# Patient Record
Sex: Male | Born: 1995 | Race: Black or African American | Hispanic: No | Marital: Single | Smoking: Never smoker
Health system: Southern US, Community
[De-identification: ages and names within clinical notes are randomized; demographics above are authoritative.]

## PROBLEM LIST (undated history)

## (undated) DIAGNOSIS — J45909 Unspecified asthma, uncomplicated: Secondary | ICD-10-CM

## (undated) HISTORY — DX: Unspecified asthma, uncomplicated: J45.909

---
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
## 2017-09-15 IMAGING — DX DG CHEST 2V
2 series · 2 of 2 positions shown · non-contrast
Comparison: None.

CLINICAL DATA: An 20-year-old male with fever and cough.

EXAM:
CHEST  2 VIEW

[chest pa]
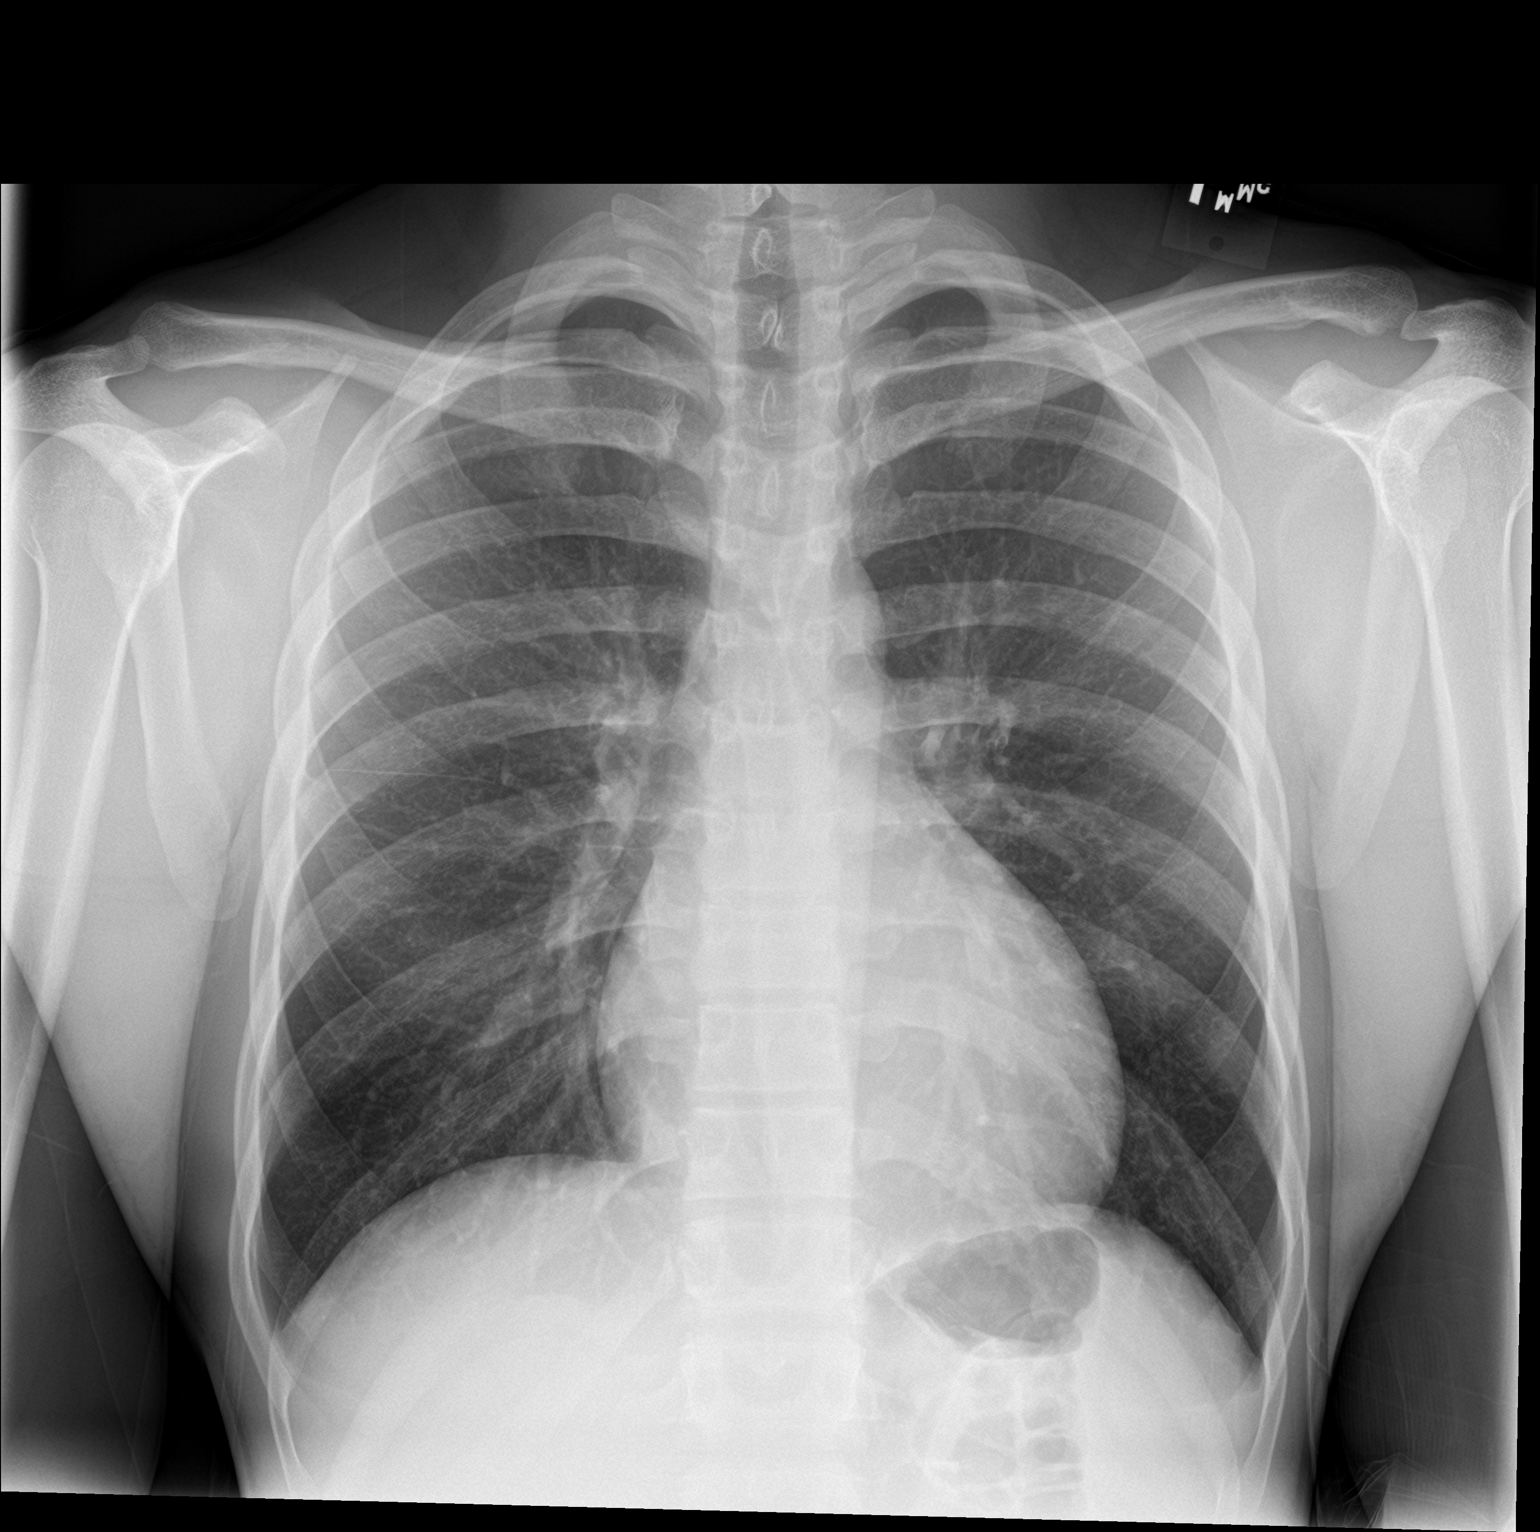

[chest lat]
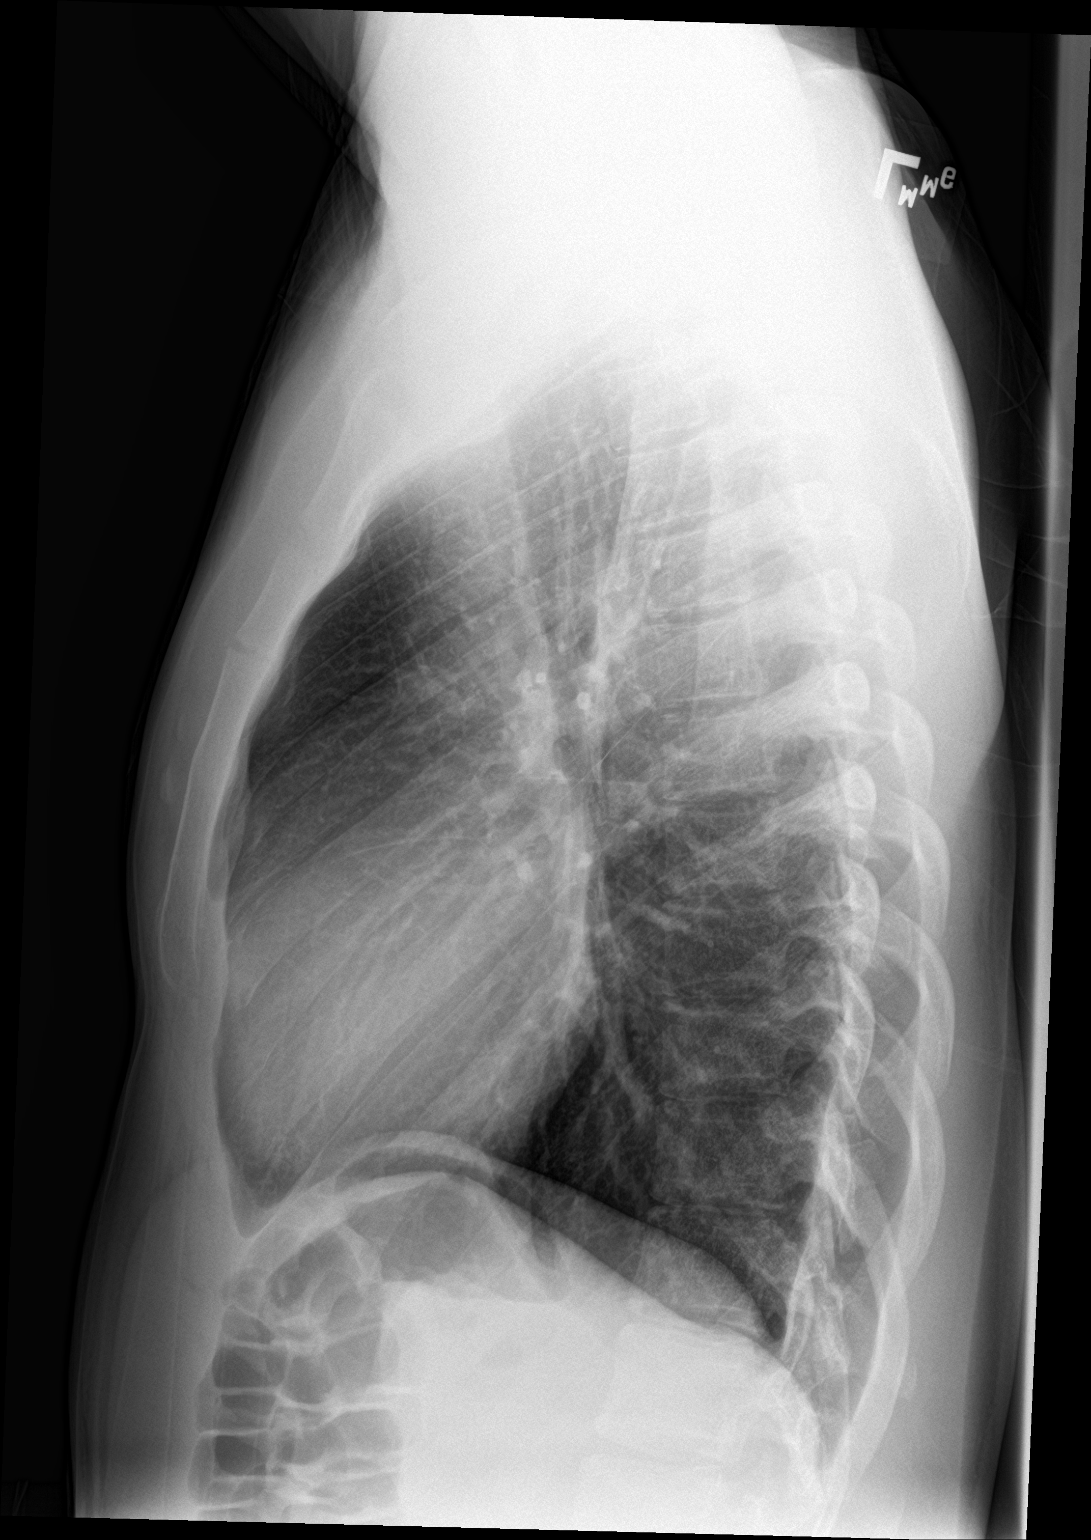

[2 of 2 positions shown; findings below may reference images not displayed]

FINDINGS: The heart size and mediastinal contours are within normal limits.
Both lungs are clear. The visualized skeletal structures are
unremarkable.
IMPRESSION: No active cardiopulmonary disease.

## 2018-11-25 ENCOUNTER — Telehealth: Payer: Self-pay | Admitting: General Practice

## 2018-11-25 ENCOUNTER — Ambulatory Visit: Payer: BC Managed Care – PPO | Admitting: Osteopathic Medicine

## 2018-11-25 NOTE — Telephone Encounter (Signed)
Patient walked into clinic and stated he couldn't keep appointment for today. He thought it was earlier in the day. Patient wanted to reschdule. He is aware you were booking out far. States because of work. He has been reshcduled. No further questions at this time.

## 2018-11-25 NOTE — Telephone Encounter (Signed)
Noted. Thanks for the update.  

## 2019-01-13 ENCOUNTER — Telehealth: Payer: Self-pay | Admitting: Osteopathic Medicine

## 2019-01-13 ENCOUNTER — Ambulatory Visit: Payer: BC Managed Care – PPO | Admitting: Osteopathic Medicine

## 2019-01-13 NOTE — Telephone Encounter (Signed)
Patient said he will call back to reschedule an appointment at a later time. No further questions at time.

## 2019-01-13 NOTE — Telephone Encounter (Signed)
No-show to establish care with Dr. Sheppard Coil, 01/13/19. If late or no-show again, will not accept patient to this clinic. Please call him to reschedule visit and inform them of this policy.

## 2019-12-21 ENCOUNTER — Ambulatory Visit (INDEPENDENT_AMBULATORY_CARE_PROVIDER_SITE_OTHER): Payer: BC Managed Care – PPO | Admitting: Family Medicine

## 2019-12-21 ENCOUNTER — Other Ambulatory Visit: Payer: Self-pay

## 2019-12-21 ENCOUNTER — Encounter: Payer: Self-pay | Admitting: Family Medicine

## 2019-12-21 DIAGNOSIS — Z0000 Encounter for general adult medical examination without abnormal findings: Secondary | ICD-10-CM | POA: Diagnosis not present

## 2019-12-21 NOTE — Patient Instructions (Signed)
Preventive Care 19-22 Years Old, Male Preventive care refers to lifestyle choices and visits with your health care provider that can promote health and wellness. This includes:  A yearly physical exam. This is also called an annual well check.  Regular dental and eye exams.  Immunizations.  Screening for certain conditions.  Healthy lifestyle choices, such as eating a healthy diet, getting regular exercise, not using drugs or products that contain nicotine and tobacco, and limiting alcohol use. What can I expect for my preventive care visit? Physical exam Your health care provider will check:  Height and weight. These may be used to calculate body mass index (BMI), which is a measurement that tells if you are at a healthy weight.  Heart rate and blood pressure.  Your skin for abnormal spots. Counseling Your health care provider may ask you questions about:  Alcohol, tobacco, and drug use.  Emotional well-being.  Home and relationship well-being.  Sexual activity.  Eating habits.  Work and work Statistician. What immunizations do I need?  Influenza (flu) vaccine  This is recommended every year. Tetanus, diphtheria, and pertussis (Tdap) vaccine  You may need a Td booster every 10 years. Varicella (chickenpox) vaccine  You may need this vaccine if you have not already been vaccinated. Human papillomavirus (HPV) vaccine  If recommended by your health care provider, you may need three doses over 6 months. Measles, mumps, and rubella (MMR) vaccine  You may need at least one dose of MMR. You may also need a second dose. Meningococcal conjugate (MenACWY) vaccine  One dose is recommended if you are 45-76 years old and a Market researcher living in a residence hall, or if you have one of several medical conditions. You may also need additional booster doses. Pneumococcal conjugate (PCV13) vaccine  You may need this if you have certain conditions and were not  previously vaccinated. Pneumococcal polysaccharide (PPSV23) vaccine  You may need one or two doses if you smoke cigarettes or if you have certain conditions. Hepatitis A vaccine  You may need this if you have certain conditions or if you travel or work in places where you may be exposed to hepatitis A. Hepatitis B vaccine  You may need this if you have certain conditions or if you travel or work in places where you may be exposed to hepatitis B. Haemophilus influenzae type b (Hib) vaccine  You may need this if you have certain risk factors. You may receive vaccines as individual doses or as more than one vaccine together in one shot (combination vaccines). Talk with your health care provider about the risks and benefits of combination vaccines. What tests do I need? Blood tests  Lipid and cholesterol levels. These may be checked every 5 years starting at age 17.  Hepatitis C test.  Hepatitis B test. Screening   Diabetes screening. This is done by checking your blood sugar (glucose) after you have not eaten for a while (fasting).  Sexually transmitted disease (STD) testing. Talk with your health care provider about your test results, treatment options, and if necessary, the need for more tests. Follow these instructions at home: Eating and drinking   Eat a diet that includes fresh fruits and vegetables, whole grains, lean protein, and low-fat dairy products.  Take vitamin and mineral supplements as recommended by your health care provider.  Do not drink alcohol if your health care provider tells you not to drink.  If you drink alcohol: ? Limit how much you have to 0-2  drinks a day. ? Be aware of how much alcohol is in your drink. In the [place] one drink equals one 12 oz bottle of beer (355 mL), one 5 oz glass of wine (148 mL), or one 1 oz glass of hard liquor (44 mL). Lifestyle  Take daily care of your teeth and gums.  Stay active. Exercise for at least 30 minutes on 5 or  more days each week.  Do not use any products that contain nicotine or tobacco, such as cigarettes, e-cigarettes, and chewing tobacco. If you need help quitting, ask your health care provider.  If you are sexually active, practice safe sex. Use a condom or other form of protection to prevent STIs (sexually transmitted infections). What's next?  Go to your health care provider once a year for a well check visit.  Ask your health care provider how often you should have your eyes and teeth checked.  Stay up to date on all vaccines. This information is not intended to replace advice given to you by your health care provider. Make sure you discuss any questions you have with your health care provider. Document Revised: 01/02/2018 Document Reviewed: 01/02/2018 Elsevier Patient Education  2020 [employer].

## 2019-12-21 NOTE — Assessment & Plan Note (Signed)
Well adult Immunizations:  Declines flu vaccine, otherwise Up to date.  Screenings: Deferred Hep C/HIV for now.   Anticipatory guidance/Risk factor reduction:  Counseled on marijuana use.  Continue healthy exercise and dietary habits.  Additional recommendations per AVS.

## 2019-12-21 NOTE — Progress Notes (Signed)
Dash Cardarelli - 24 y.o. male MRN [id number]  Date of birth: 1995/09/25  Subjective Chief Complaint  Patient presents with  . Establish Care  . Annual Exam    HPI Erik Waller is a 24 y.o. male here today for initial visit and annual exam.  He has a history of intermittent asthma but has been in good health otherwise.  He denies new concerns today.   He has a grandmother with HTN and stroke but family history otherwise is unremarkable.   He does not use tobacco products.  He does smoke marijuana a couple of times per week.   He denies EtOH use.   He works as a Systems analyst.  He had COVID vaccines.  Declines flu vaccine today.   Review of Systems  Constitutional: Negative for chills, fever, malaise/fatigue and weight loss.  HENT: Negative for congestion, ear pain and sore throat.   Eyes: Negative for blurred vision, double vision and pain.  Respiratory: Negative for cough and shortness of breath.   Cardiovascular: Negative for chest pain and palpitations.  Gastrointestinal: Negative for abdominal pain, blood in stool, constipation, heartburn and nausea.  Genitourinary: Negative for dysuria and urgency.  Musculoskeletal: Negative for joint pain and myalgias.  Neurological: Negative for dizziness and headaches.  Endo/Heme/Allergies: Does not bruise/bleed easily.  Psychiatric/Behavioral: Negative for depression. The patient is not nervous/anxious and does not have insomnia.     No Known Allergies  Past Medical History:  Diagnosis Date  . Asthma     History reviewed. No pertinent surgical history.  Social History   Socioeconomic History  . Marital status: Single    Spouse name: Not on file  . Number of children: Not on file  . Years of education: Not on file  . Highest education level: Not on file  Occupational History  . Not on file  Tobacco Use  . Smoking status: Never Smoker  . Smokeless tobacco: Never Used  Substance and Sexual Activity  . Alcohol  use: No  . Drug use: Yes    Types: Marijuana  . Sexual activity: Not on file  Other Topics Concern  . Not on file  Social History Narrative  . Not on file   Social Determinants of Health   Financial Resource Strain:   . Difficulty of Paying Living Expenses: Not on file  Food Insecurity:   . Worried About Programme researcher, broadcasting/film/video in the Last Year: Not on file  . Ran Out of Food in the Last Year: Not on file  Transportation Needs:   . Lack of Transportation (Medical): Not on file  . Lack of Transportation (Non-Medical): Not on file  Physical Activity:   . Days of Exercise per Week: Not on file  . Minutes of Exercise per Session: Not on file  Stress:   . Feeling of Stress : Not on file  Social Connections:   . Frequency of Communication with Friends and Family: Not on file  . Frequency of Social Gatherings with Friends and Family: Not on file  . Attends Religious Services: Not on file  . Active Member of Clubs or Organizations: Not on file  . Attends Banker Meetings: Not on file  . Marital Status: Not on file    Family History  Problem Relation Age of Onset  . Hypertension Maternal Grandmother   . Stroke Maternal Grandmother     Health Maintenance  Topic Date Due  . Hepatitis C Screening  Never done  . HIV Screening  Never done  . TETANUS/TDAP  12/02/2017  . INFLUENZA VACCINE  04/21/2020 (Originally 08/23/2019)  . COVID-19 Vaccine  Completed     ----------------------------------------------------------------------------------------------------------------------------------------------------------------------------------------------------------------- Physical Exam BP (!) 114/48 (BP Location: Left Arm, Patient Position: Sitting, Cuff Size: Normal)   Pulse (!) 55   Temp 98 F (36.7 C)   Ht 5\' 7"  (1.702 m)   Wt 151 lb (68.5 kg)   SpO2 100%   BMI 23.65 kg/m   Physical Exam Constitutional:      General: He is not in acute distress.    Appearance: Normal  appearance.  HENT:     Head: Normocephalic and atraumatic.     Right Ear: Tympanic membrane and external ear normal.     Left Ear: Tympanic membrane and external ear normal.  Eyes:     General: No scleral icterus. Neck:     Thyroid: No thyromegaly.  Cardiovascular:     Rate and Rhythm: Normal rate and regular rhythm.     Heart sounds: Normal heart sounds.  Pulmonary:     Effort: Pulmonary effort is normal.     Breath sounds: Normal breath sounds.  Abdominal:     General: Bowel sounds are normal. There is no distension.     Palpations: Abdomen is soft.     Tenderness: There is no abdominal tenderness. There is no guarding.  Musculoskeletal:     Cervical back: Normal range of motion.  Lymphadenopathy:     Cervical: No cervical adenopathy.  Skin:    General: Skin is warm and dry.     Findings: No rash.  Neurological:     General: No focal deficit present.     Mental Status: He is alert and oriented to person, place, and time.     Cranial Nerves: No cranial nerve deficit.     Motor: No abnormal muscle tone.  Psychiatric:        Mood and Affect: Mood normal.        Behavior: Behavior normal.     ------------------------------------------------------------------------------------------------------------------------------------------------------------------------------------------------------------------- Assessment and Plan  Well adult exam Well adult Immunizations:  Declines flu vaccine, otherwise Up to date.  Screenings: Deferred Hep C/HIV for now.   Anticipatory guidance/Risk factor reduction:  Counseled on marijuana use.  Continue healthy exercise and dietary habits.  Additional recommendations per AVS.     No orders of the defined types were placed in this encounter.   No follow-ups on file.    This visit occurred during the SARS-CoV-2 public health emergency.  Safety protocols were in place, including screening questions prior to the visit, additional usage of  staff PPE, and extensive cleaning of exam room while observing appropriate contact time as indicated for disinfecting solutions.

## 2022-02-21 ENCOUNTER — Encounter: Payer: Self-pay | Admitting: Family Medicine

## 2022-02-21 ENCOUNTER — Ambulatory Visit (INDEPENDENT_AMBULATORY_CARE_PROVIDER_SITE_OTHER): Payer: No Typology Code available for payment source | Admitting: Family Medicine

## 2022-02-21 VITALS — BP 117/65 | HR 54 | Ht 70.0 in | Wt 154.0 lb

## 2022-02-21 DIAGNOSIS — Z0000 Encounter for general adult medical examination without abnormal findings: Secondary | ICD-10-CM | POA: Diagnosis not present

## 2022-02-21 DIAGNOSIS — H0012 Chalazion right lower eyelid: Secondary | ICD-10-CM

## 2022-02-21 DIAGNOSIS — Z13220 Encounter for screening for lipoid disorders: Secondary | ICD-10-CM

## 2022-02-21 LAB — CBC WITH DIFFERENTIAL/PLATELET
Basophils Absolute: 20 cells/uL (ref 0–200)
HCT: 44.5 % (ref 38.5–50.0)
Hemoglobin: 15.3 g/dL (ref 13.2–17.1)
Lymphs Abs: 2077 cells/uL (ref 850–3900)
MCH: 30.4 pg (ref 27.0–33.0)
MCHC: 34.4 g/dL (ref 32.0–36.0)
MPV: 10.6 fL (ref 7.5–12.5)
Platelets: 212 10*3/uL (ref 140–400)
RBC: 5.03 10*6/uL (ref 4.20–5.80)
RDW: 12.1 % (ref 11.0–15.0)
WBC: 6.7 10*3/uL (ref 3.8–10.8)

## 2022-02-21 NOTE — Progress Notes (Signed)
Erik Waller - 27 y.o. male MRN [id number]  Date of birth: [birth year]  Subjective No chief complaint on file.   HPI Erik Waller is a 27 year old male here today for annual exam.  Reports he is doing fairly well.  He does have a swollen area under the right eye.  This has been present for about 3 months.  He has tried warm compresses without much improvement.  He does stay pretty active.  He works as a Physiological scientist.  He feels like his diet is pretty healthy.  He does use marijuana occasionally.  Rare alcohol consumption.  Declines immunizations at this time.  Review of Systems  Constitutional:  Negative for chills, fever, malaise/fatigue and weight loss.  HENT:  Negative for congestion, ear pain and sore throat.   Eyes:  Negative for blurred vision, double vision and pain.  Respiratory:  Negative for cough and shortness of breath.   Cardiovascular:  Negative for chest pain and palpitations.  Gastrointestinal:  Negative for abdominal pain, blood in stool, constipation, heartburn and nausea.  Genitourinary:  Negative for dysuria and urgency.  Musculoskeletal:  Negative for joint pain and myalgias.  Neurological:  Negative for dizziness and headaches.  Endo/Heme/Allergies:  Does not bruise/bleed easily.  Psychiatric/Behavioral:  Negative for depression. The patient is not nervous/anxious and does not have insomnia.     No Known Allergies  Past Medical History:  Diagnosis Date   Asthma     History reviewed. No pertinent surgical history.  Social History   Socioeconomic History   Marital status: Single    Spouse name: Not on file   Number of children: Not on file   Years of education: Not on file   Highest education level: Not on file  Occupational History   Not on file  Tobacco Use   Smoking status: Never   Smokeless tobacco: Never  Substance and Sexual Activity   Alcohol use: No   Drug use: Yes    Types: Marijuana   Sexual activity: Yes    Partners:  Female    Birth control/protection: Condom  Other Topics Concern   Not on file  Social History Narrative   Not on file   Social Determinants of Health   Financial Resource Strain: Not on file  Food Insecurity: Not on file  Transportation Needs: Not on file  Physical Activity: Not on file  Stress: Not on file  Social Connections: Not on file    Family History  Problem Relation Age of Onset   Hypertension Maternal Grandmother    Stroke Maternal Grandmother     Health Maintenance  Topic Date Due   HIV Screening  Never done   Hepatitis C Screening  Never done   DTaP/Tdap/Td (8 - Td or Tdap) 12/02/2017   COVID-19 Vaccine (3 - 2023-24 season) 09/22/2021   INFLUENZA VACCINE  Completed   HPV VACCINES  Completed     ----------------------------------------------------------------------------------------------------------------------------------------------------------------------------------------------------------------- Physical Exam BP 117/65   Pulse (!) 54   Ht 5\' 10"  (1.778 m)   Wt 154 lb (69.9 kg)   SpO2 98%   BMI 22.10 kg/m   Physical Exam Constitutional:      General: He is not in acute distress. HENT:     Head: Normocephalic and atraumatic.     Right Ear: Tympanic membrane and external ear normal.     Left Ear: Tympanic membrane and external ear normal.  Eyes:     General: No scleral icterus. Neck:     Thyroid: No thyromegaly.  Cardiovascular:     Rate and Rhythm: Normal rate and regular rhythm.     Heart sounds: Normal heart sounds.  Pulmonary:     Effort: Pulmonary effort is normal.     Breath sounds: Normal breath sounds.  Abdominal:     General: Bowel sounds are normal. There is no distension.     Palpations: Abdomen is soft.     Tenderness: There is no abdominal tenderness. There is no guarding.  Musculoskeletal:     Cervical back: Normal range of motion.  Lymphadenopathy:     Cervical: No cervical adenopathy.  Skin:    General: Skin is warm  and dry.     Findings: No rash.  Neurological:     Mental Status: He is alert and oriented to person, place, and time.     Cranial Nerves: No cranial nerve deficit.     Motor: No abnormal muscle tone.  Psychiatric:        Mood and Affect: Mood normal.        Behavior: Behavior normal.     ------------------------------------------------------------------------------------------------------------------------------------------------------------------------------------------------------------------- Assessment and Plan  Well adult exam Well adult Orders Placed This Encounter  Procedures   COMPLETE METABOLIC PANEL WITH GFR   CBC with Differential   Lipid Panel w/reflex Direct LDL   Ambulatory referral to Ophthalmology    Referral Priority:   Routine    Referral Type:   Consultation    Referral Reason:   Specialty Services Required    Requested Specialty:   Ophthalmology    Number of Visits Requested:   1  Screenings: Per lab orders Immunizations: Declines Anticipatory guidance/risk factor reduction: Recommendations previous   No orders of the defined types were placed in this encounter.   No follow-ups on file.    This visit occurred during the SARS-CoV-2 public health emergency.  Safety protocols were in place, including screening questions prior to the visit, additional usage of staff PPE, and extensive cleaning of exam room while observing appropriate contact time as indicated for disinfecting solutions.

## 2022-02-21 NOTE — Patient Instructions (Signed)
Preventive Care 21-25 Years Old, Male Preventive care refers to lifestyle choices and visits with your health care provider that can promote health and wellness. Preventive care visits are also called wellness exams. What can I expect for my preventive care visit? Counseling During your preventive care visit, your health care provider may ask about your: Medical history, including: Past medical problems. Family medical history. Current health, including: Emotional well-being. Home life and relationship well-being. Sexual activity. Lifestyle, including: Alcohol, nicotine or tobacco, and drug use. Access to firearms. Diet, exercise, and sleep habits. Safety issues such as seatbelt and bike helmet use. Sunscreen use. Work and work environment. Physical exam Your health care provider may check your: Height and weight. These may be used to calculate your BMI (body mass index). BMI is a measurement that tells if you are at a healthy weight. Waist circumference. This measures the distance around your waistline. This measurement also tells if you are at a healthy weight and may help predict your risk of certain diseases, such as type 2 diabetes and high blood pressure. Heart rate and blood pressure. Body temperature. Skin for abnormal spots. What immunizations do I need?  Vaccines are usually given at various ages, according to a schedule. Your health care provider will recommend vaccines for you based on your age, medical history, and lifestyle or other factors, such as travel or where you work. What tests do I need? Screening Your health care provider may recommend screening tests for certain conditions. This may include: Lipid and cholesterol levels. Diabetes screening. This is done by checking your blood sugar (glucose) after you have not eaten for a while (fasting). Hepatitis B test. Hepatitis C test. HIV (human immunodeficiency virus) test. STI (sexually transmitted infection)  testing, if you are at risk. Talk with your health care provider about your test results, treatment options, and if necessary, the need for more tests. Follow these instructions at home: Eating and drinking  Eat a healthy diet that includes fresh fruits and vegetables, whole grains, lean protein, and low-fat dairy products. Drink enough fluid to keep your urine pale yellow. Take vitamin and mineral supplements as recommended by your health care provider. Do not drink alcohol if your health care provider tells you not to drink. If you drink alcohol: Limit how much you have to 0-2 drinks a day. Know how much alcohol is in your drink. In the [place] one drink equals one 12 oz bottle of beer (355 mL), one 5 oz glass of wine (148 mL), or one 1 oz glass of hard liquor (44 mL). Lifestyle Brush your teeth every morning and night with fluoride toothpaste. Floss one time each day. Exercise for at least 30 minutes 5 or more days each week. Do not use any products that contain nicotine or tobacco. These products include cigarettes, chewing tobacco, and vaping devices, such as e-cigarettes. If you need help quitting, ask your health care provider. Do not use drugs. If you are sexually active, practice safe sex. Use a condom or other form of protection to prevent STIs. Find healthy ways to manage stress, such as: Meditation, yoga, or listening to music. Journaling. Talking to a trusted person. Spending time with friends and family. Minimize exposure to UV radiation to reduce your risk of skin cancer. Safety Always wear your seat belt while driving or riding in a vehicle. Do not drive: If you have been drinking alcohol. Do not ride with someone who has been drinking. If you have been using any mind-altering substances   or drugs. While texting. When you are tired or distracted. Wear a helmet and other protective equipment during sports activities. If you have firearms in your house, make sure you  follow all gun safety procedures. Seek help if you have been physically or sexually abused. What's next? Go to your health care provider once a year for an annual wellness visit. Ask your health care provider how often you should have your eyes and teeth checked. Stay up to date on all vaccines. This information is not intended to replace advice given to you by your health care provider. Make sure you discuss any questions you have with your health care provider. Document Revised: 07/06/2020 Document Reviewed: 07/06/2020 Elsevier Patient Education  2023 [employer] Inc[employer]  

## 2022-02-21 NOTE — Assessment & Plan Note (Signed)
Well adult Orders Placed This Encounter  Procedures   COMPLETE METABOLIC PANEL WITH GFR   CBC with Differential   Lipid Panel w/reflex Direct LDL   Ambulatory referral to Ophthalmology    Referral Priority:   Routine    Referral Type:   Consultation    Referral Reason:   Specialty Services Required    Requested Specialty:   Ophthalmology    Number of Visits Requested:   1  Screenings: Per lab orders Immunizations: Declines Anticipatory guidance/risk factor reduction: Recommendations previous

## 2022-02-22 LAB — CBC WITH DIFFERENTIAL/PLATELET
Absolute Monocytes: 536 cells/uL (ref 200–950)
Basophils Relative: 0.3 %
Eosinophils Absolute: 40 cells/uL (ref 15–500)
Eosinophils Relative: 0.6 %
MCV: 88.5 fL (ref 80.0–100.0)
Monocytes Relative: 8 %
Neutro Abs: 4027 cells/uL (ref 1500–7800)
Neutrophils Relative %: 60.1 %
Total Lymphocyte: 31 %

## 2022-02-22 LAB — LIPID PANEL W/REFLEX DIRECT LDL
Cholesterol: 148 mg/dL (ref <200)
HDL: 57 mg/dL (ref >=40)
LDL Cholesterol (Calc): 76 mg/dL (calc)
Non-HDL Cholesterol (Calc): 91 mg/dL (calc) (ref <130)
Total CHOL/HDL Ratio: 2.6 (calc) (ref <5.0)
Triglycerides: 74 mg/dL (ref <150)

## 2022-02-22 LAB — COMPLETE METABOLIC PANEL WITH GFR
AG Ratio: 1.7 (calc) (ref 1.0–2.5)
ALT: 25 U/L (ref 9–46)
AST: 21 U/L (ref 10–40)
Albumin: 4.8 g/dL (ref 3.6–5.1)
Alkaline phosphatase (APISO): 50 U/L (ref 36–130)
BUN: 10 mg/dL (ref 7–25)
CO2: 32 mmol/L (ref 20–32)
Calcium: 9.7 mg/dL (ref 8.6–10.3)
Chloride: 104 mmol/L (ref 98–110)
Creat: 0.92 mg/dL (ref 0.60–1.24)
Globulin: 2.9 g/dL (calc) (ref 1.9–3.7)
Glucose, Bld: 67 mg/dL (ref 65–99)
Potassium: 4.4 mmol/L (ref 3.5–5.3)
Sodium: 141 mmol/L (ref 135–146)
Total Bilirubin: 0.7 mg/dL (ref 0.2–1.2)
Total Protein: 7.7 g/dL (ref 6.1–8.1)
eGFR: 119 mL/min/{1.73_m2} (ref >=60)

## 2023-05-15 ENCOUNTER — Encounter: Admitting: Family Medicine

## 2023-05-21 ENCOUNTER — Encounter: Admitting: Family Medicine

## 2023-05-28 ENCOUNTER — Encounter: Admitting: Family Medicine

## 2023-05-29 ENCOUNTER — Encounter: Admitting: Family Medicine

## 2023-05-29 ENCOUNTER — Encounter (HOSPITAL_COMMUNITY): Payer: Self-pay

## 2023-06-05 ENCOUNTER — Ambulatory Visit (INDEPENDENT_AMBULATORY_CARE_PROVIDER_SITE_OTHER): Admitting: Family Medicine

## 2023-06-05 VITALS — BP 114/67 | HR 57 | Ht 70.0 in | Wt 163.0 lb

## 2023-06-05 DIAGNOSIS — Z13220 Encounter for screening for lipoid disorders: Secondary | ICD-10-CM | POA: Diagnosis not present

## 2023-06-05 DIAGNOSIS — Z0000 Encounter for general adult medical examination without abnormal findings: Secondary | ICD-10-CM

## 2023-06-05 NOTE — Patient Instructions (Signed)
 Preventive Care 76-26 Years Old, Male Preventive care refers to lifestyle choices and visits with your health care provider that can promote health and wellness. Preventive care visits are also called wellness exams. What can I expect for my preventive care visit? Counseling During your preventive care visit, your health care provider may ask about your: Medical history, including: Past medical problems. Family medical history. Current health, including: Emotional well-being. Home life and relationship well-being. Sexual activity. Lifestyle, including: Alcohol, nicotine or tobacco, and drug use. Access to firearms. Diet, exercise, and sleep habits. Safety issues such as seatbelt and bike helmet use. Sunscreen use. Work and work Astronomer. Physical exam Your health care provider may check your: Height and weight. These may be used to calculate your BMI (body mass index). BMI is a measurement that tells if you are at a healthy weight. Waist circumference. This measures the distance around your waistline. This measurement also tells if you are at a healthy weight and may help predict your risk of certain diseases, such as type 2 diabetes and high blood pressure. Heart rate and blood pressure. Body temperature. Skin for abnormal spots. What immunizations do I need?  Vaccines are usually given at various ages, according to a schedule. Your health care provider will recommend vaccines for you based on your age, medical history, and lifestyle or other factors, such as travel or where you work. What tests do I need? Screening Your health care provider may recommend screening tests for certain conditions. This may include: Lipid and cholesterol levels. Diabetes screening. This is done by checking your blood sugar (glucose) after you have not eaten for a while (fasting). Hepatitis B test. Hepatitis C test. HIV (human immunodeficiency virus) test. STI (sexually transmitted infection)  testing, if you are at risk. Talk with your health care provider about your test results, treatment options, and if necessary, the need for more tests. Follow these instructions at home: Eating and drinking  Eat a healthy diet that includes fresh fruits and vegetables, whole grains, lean protein, and low-fat dairy products. Drink enough fluid to keep your urine pale yellow. Take vitamin and mineral supplements as recommended by your health care provider. Do not drink alcohol if your health care provider tells you not to drink. If you drink alcohol: Limit how much you have to 0-2 drinks a day. Know how much alcohol is in your drink. In the [place] one drink equals one 12 oz bottle of beer (355 mL), one 5 oz glass of wine (148 mL), or one 1 oz glass of hard liquor (44 mL). Lifestyle Brush your teeth every morning and night with fluoride toothpaste. Floss one time each day. Exercise for at least 30 minutes 5 or more days each week. Do not use any products that contain nicotine or tobacco. These products include cigarettes, chewing tobacco, and vaping devices, such as e-cigarettes. If you need help quitting, ask your health care provider. Do not use drugs. If you are sexually active, practice safe sex. Use a condom or other form of protection to prevent STIs. Find healthy ways to manage stress, such as: Meditation, yoga, or listening to music. Journaling. Talking to a trusted person. Spending time with friends and family. Minimize exposure to UV radiation to reduce your risk of skin cancer. Safety Always wear your seat belt while driving or riding in a vehicle. Do not drive: If you have been drinking alcohol. Do not ride with someone who has been drinking. If you have been using any mind-altering substances  or drugs. While texting. When you are tired or distracted. Wear a helmet and other protective equipment during sports activities. If you have firearms in your house, make sure you  follow all gun safety procedures. Seek help if you have been physically or sexually abused. What's next? Go to your health care provider once a year for an annual wellness visit. Ask your health care provider how often you should have your eyes and teeth checked. Stay up to date on all vaccines. This information is not intended to replace advice given to you by your health care provider. Make sure you discuss any questions you have with your health care provider. Document Revised: 07/06/2020 Document Reviewed: 07/06/2020 Elsevier Patient Education  2024 [employer].

## 2023-06-05 NOTE — Assessment & Plan Note (Addendum)
 Well adult Orders Placed This Encounter  Procedures   Lipid panel   CBC with Differential/Platelet   CMP14+EGFR  Screenings: Per lab orders Immunizations: Declines Anticipatory guidance/risk factor reduction: Recommendations per AVS

## 2023-06-05 NOTE — Progress Notes (Signed)
 Erik Waller - 28 y.o. male MRN [id number]  Date of birth: Mar 11, 1995  Subjective Chief Complaint  Patient presents with   Annual Exam    HPI Erik Waller is a 28 y.o. male here today for annual exam.   He has been in good health and denies new concerns.   He does stay pretty active and feels that his diet is pretty good  He is a non-smoker.  Rare EtOH use.   He does have regular dental care.  Review of Systems  Constitutional:  Negative for chills, fever, malaise/fatigue and weight loss.  HENT:  Negative for congestion, ear pain and sore throat.   Eyes:  Negative for blurred vision, double vision and pain.  Respiratory:  Negative for cough and shortness of breath.   Cardiovascular:  Negative for chest pain and palpitations.  Gastrointestinal:  Negative for abdominal pain, blood in stool, constipation, heartburn and nausea.  Genitourinary:  Negative for dysuria and urgency.  Musculoskeletal:  Negative for joint pain and myalgias.  Neurological:  Negative for dizziness and headaches.  Endo/Heme/Allergies:  Does not bruise/bleed easily.  Psychiatric/Behavioral:  Negative for depression. The patient is not nervous/anxious and does not have insomnia.     No Known Allergies  Past Medical History:  Diagnosis Date   Asthma     No past surgical history on file.  Social History   Socioeconomic History   Marital status: Single    Spouse name: Not on file   Number of children: Not on file   Years of education: Not on file   Highest education level: Bachelor's degree (e.g., BA, AB, BS)  Occupational History   Not on file  Tobacco Use   Smoking status: Never   Smokeless tobacco: Never  Substance and Sexual Activity   Alcohol use: No   Drug use: Yes    Types: Marijuana   Sexual activity: Yes    Partners: Female    Birth control/protection: Condom  Other Topics Concern   Not on file  Social History Narrative   Not on file   Social Drivers of Health    Financial Resource Strain: Medium Risk (06/05/2023)   Overall Financial Resource Strain (CARDIA)    Difficulty of Paying Living Expenses: Somewhat hard  Food Insecurity: Food Insecurity Present (06/05/2023)   Hunger Vital Sign    Worried About Running Out of Food in the Last Year: Not on file    Ran Out of Food in the Last Year: Sometimes true  Transportation Needs: No Transportation Needs (06/05/2023)   PRAPARE - Administrator, Civil Service (Medical): No    Lack of Transportation (Non-Medical): No  Physical Activity: Insufficiently Active (06/05/2023)   Exercise Vital Sign    Days of Exercise per Week: 3 days    Minutes of Exercise per Session: 30 min  Stress: Stress Concern Present (06/05/2023)   [employer] of Occupational Health - Occupational Stress Questionnaire    Feeling of Stress : To some extent  Social Connections: Unknown (06/05/2023)   Social Connection and Isolation Panel [NHANES]    Frequency of Communication with Friends and Family: Three times a week    Frequency of Social Gatherings with Friends and Family: Twice a week    Attends Religious Services: Not on Marketing executive or Organizations: No    Attends Banker Meetings: Not on file    Marital Status: Not on file    Family History  Problem Relation  Age of Onset   Hypertension Maternal Grandmother    Stroke Maternal Grandmother     Health Maintenance  Topic Date Due   HIV Screening  Never done   Hepatitis C Screening  Never done   COVID-19 Vaccine (3 - 2024-25 season) 09/23/2022   INFLUENZA VACCINE  08/23/2023   DTaP/Tdap/Td (9 - Td or Tdap) 11/02/2031   Pneumococcal Vaccine 35-88 Years old  Completed   HPV VACCINES  Completed   Meningococcal B Vaccine  Completed      ----------------------------------------------------------------------------------------------------------------------------------------------------------------------------------------------------------------- Physical Exam BP 114/67 (BP Location: Left Arm, Patient Position: Sitting, Cuff Size: Normal)   Pulse (!) 57   Ht 5\' 10"  (1.778 m)   Wt 163 lb (73.9 kg)   SpO2 100%   BMI 23.39 kg/m   Physical Exam Constitutional:      General: He is not in acute distress. HENT:     Head: Normocephalic and atraumatic.     Right Ear: Tympanic membrane and external ear normal.     Left Ear: Tympanic membrane and external ear normal.  Eyes:     General: No scleral icterus. Neck:     Thyroid: No thyromegaly.  Cardiovascular:     Rate and Rhythm: Normal rate and regular rhythm.     Heart sounds: Normal heart sounds.  Pulmonary:     Effort: Pulmonary effort is normal.     Breath sounds: Normal breath sounds.  Abdominal:     General: Bowel sounds are normal. There is no distension.     Palpations: Abdomen is soft.     Tenderness: There is no abdominal tenderness. There is no guarding.  Musculoskeletal:     Cervical back: Normal range of motion.  Lymphadenopathy:     Cervical: No cervical adenopathy.  Skin:    General: Skin is warm and dry.     Findings: No rash.  Neurological:     Mental Status: He is alert and oriented to person, place, and time.     Cranial Nerves: No cranial nerve deficit.     Motor: No abnormal muscle tone.  Psychiatric:        Mood and Affect: Mood normal.        Behavior: Behavior normal.     ------------------------------------------------------------------------------------------------------------------------------------------------------------------------------------------------------------------- Assessment and Plan  Well adult exam Well adult Orders Placed This Encounter  Procedures   Lipid panel   CBC with Differential/Platelet   CMP14+EGFR   Screenings: Per lab orders Immunizations: Declines Anticipatory guidance/risk factor reduction: Recommendations per AVS   No orders of the defined types were placed in this encounter.   No follow-ups on file.

## 2023-06-06 LAB — CBC WITH DIFFERENTIAL/PLATELET
Basophils Absolute: 0 10*3/uL (ref 0.0–0.2)
Basos: 0 %
EOS (ABSOLUTE): 0.1 10*3/uL (ref 0.0–0.4)
Eos: 1 %
Hematocrit: 46.4 % (ref 37.5–51.0)
Hemoglobin: 15.3 g/dL (ref 13.0–17.7)
Immature Grans (Abs): 0 10*3/uL (ref 0.0–0.1)
Immature Granulocytes: 0 %
Lymphocytes Absolute: 1.9 10*3/uL (ref 0.7–3.1)
Lymphs: 33 %
MCH: 30.5 pg (ref 26.6–33.0)
MCHC: 33 g/dL (ref 31.5–35.7)
MCV: 93 fL (ref 79–97)
Monocytes Absolute: 0.5 10*3/uL (ref 0.1–0.9)
Monocytes: 8 %
Neutrophils Absolute: 3.2 10*3/uL (ref 1.4–7.0)
Neutrophils: 58 %
Platelets: 204 10*3/uL (ref 150–450)
RBC: 5.01 x10E6/uL (ref 4.14–5.80)
RDW: 12.3 % (ref 11.6–15.4)
WBC: 5.7 10*3/uL (ref 3.4–10.8)

## 2023-06-06 LAB — CMP14+EGFR
ALT: 35 IU/L (ref 0–44)
AST: 30 IU/L (ref 0–40)
Albumin: 4.7 g/dL (ref 4.3–5.2)
Alkaline Phosphatase: 65 IU/L (ref 44–121)
BUN/Creatinine Ratio: 10 (ref 9–20)
BUN: 9 mg/dL (ref 6–20)
Bilirubin Total: 0.5 mg/dL (ref 0.0–1.2)
CO2: 26 mmol/L (ref 20–29)
Calcium: 9.6 mg/dL (ref 8.7–10.2)
Chloride: 102 mmol/L (ref 96–106)
Creatinine, Ser: 0.88 mg/dL (ref 0.76–1.27)
Globulin, Total: 2.7 g/dL (ref 1.5–4.5)
Glucose: 61 mg/dL — ABNORMAL LOW (ref 70–99)
Potassium: 4.4 mmol/L (ref 3.5–5.2)
Sodium: 140 mmol/L (ref 134–144)
Total Protein: 7.4 g/dL (ref 6.0–8.5)
eGFR: 122 mL/min/{1.73_m2} (ref >59)

## 2023-06-06 LAB — LIPID PANEL
Chol/HDL Ratio: 2.9 ratio (ref 0.0–5.0)
Cholesterol, Total: 155 mg/dL (ref 100–199)
HDL: 54 mg/dL (ref >39)
LDL Chol Calc (NIH): 89 mg/dL (ref 0–99)
Triglycerides: 59 mg/dL (ref 0–149)
VLDL Cholesterol Cal: 12 mg/dL (ref 5–40)

## 2023-06-14 ENCOUNTER — Ambulatory Visit: Payer: Self-pay | Admitting: Family Medicine
